# Patient Record
Sex: Male | Born: 1965 | Race: White | Hispanic: No | Marital: Single | Smoking: Current every day smoker
Health system: Southern US, Community
[De-identification: ages and names within clinical notes are randomized; demographics above are authoritative.]

---

## 2008-11-20 ENCOUNTER — Ambulatory Visit: Payer: Self-pay | Admitting: Family Medicine

## 2008-11-20 DIAGNOSIS — R109 Unspecified abdominal pain: Secondary | ICD-10-CM | POA: Insufficient documentation

## 2008-11-21 ENCOUNTER — Encounter: Admission: RE | Admit: 2008-11-21 | Discharge: 2008-11-21 | Payer: Self-pay | Admitting: Family Medicine

## 2008-11-22 ENCOUNTER — Encounter: Payer: Self-pay | Admitting: Family Medicine

## 2008-11-22 ENCOUNTER — Telehealth: Payer: Self-pay | Admitting: Family Medicine

## 2010-11-08 IMAGING — US US ABDOMEN COMPLETE
1 series · 14 of 25 positions shown · non-contrast
Comparison: None

CLINICAL DATA: Nausea, vomiting, abdominal pain

COMPLETE ABDOMINAL ULTRASOUND

[Series 1: us abdomen complete · 0.29mm/px · 14 of 78 slices shown]
[im 1/78]
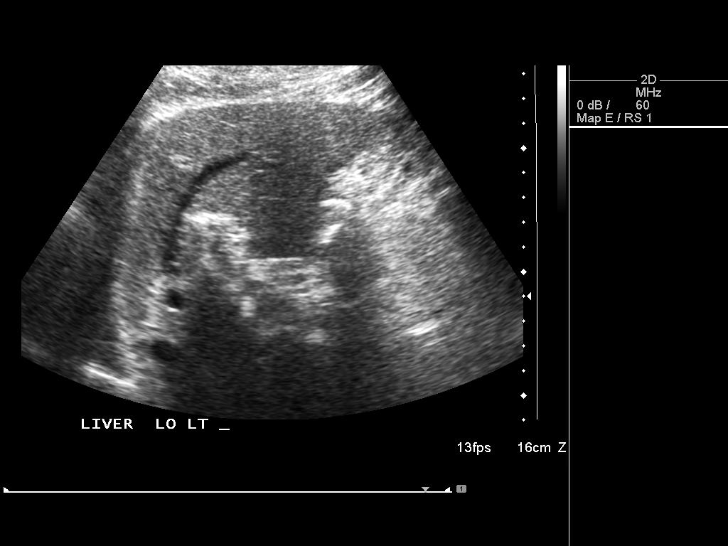
[im 7/78]
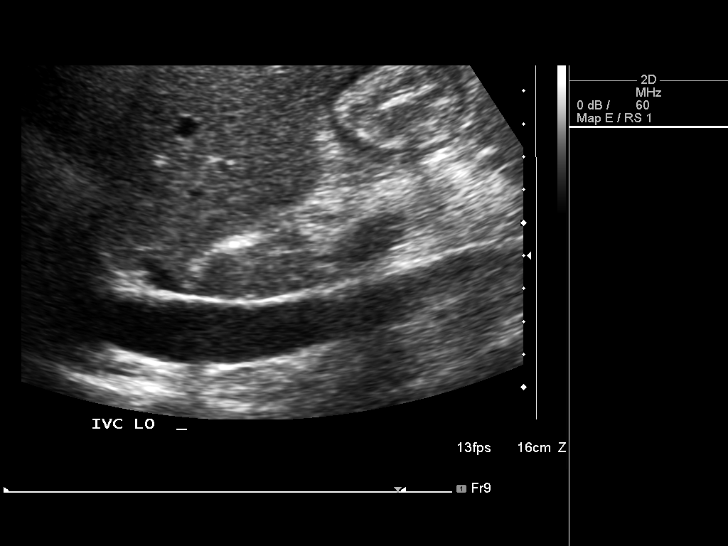
[im 13/78]
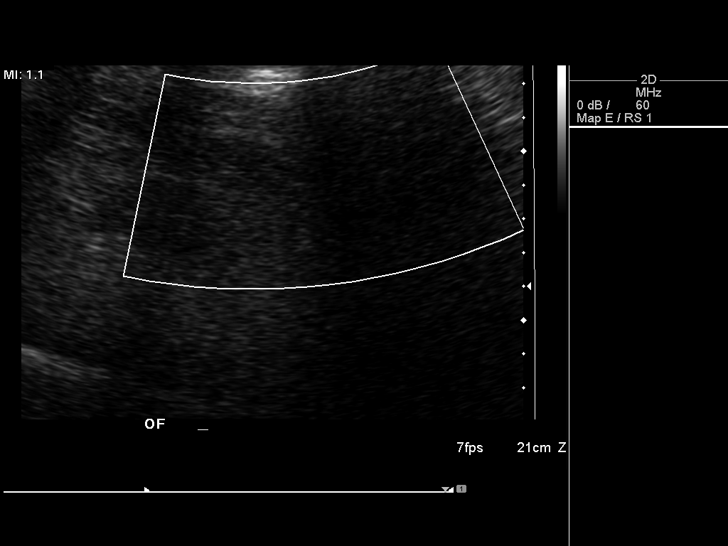
[im 20/78]
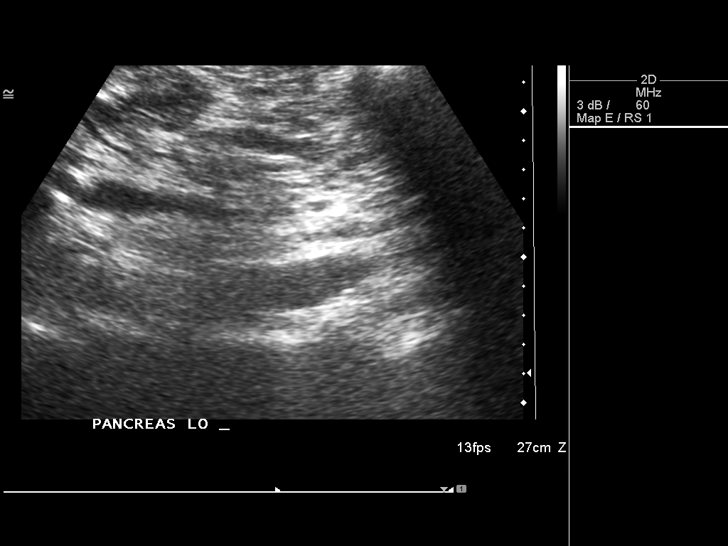
[im 26/78]
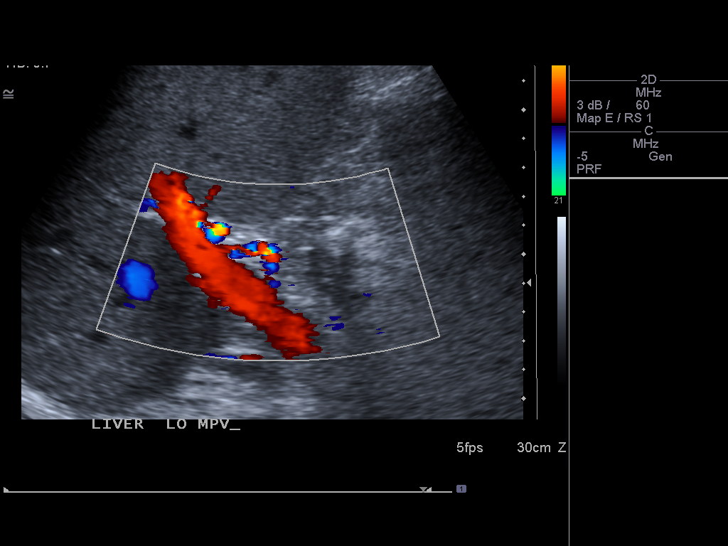
[im 29/78]
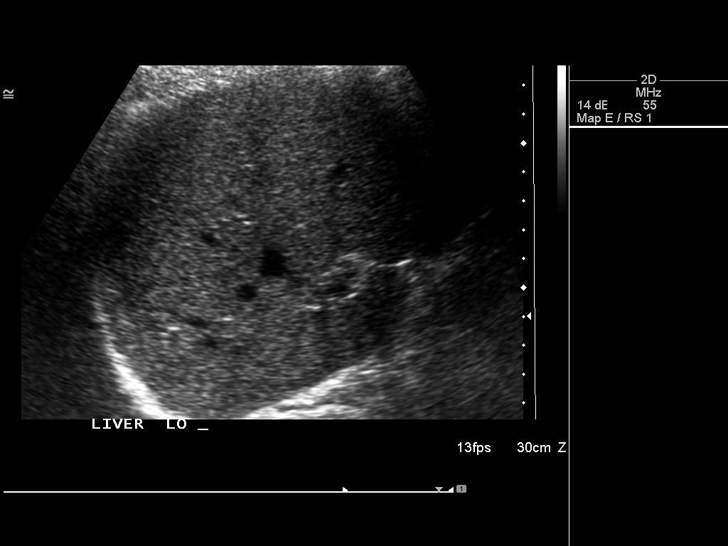
[im 36/78]
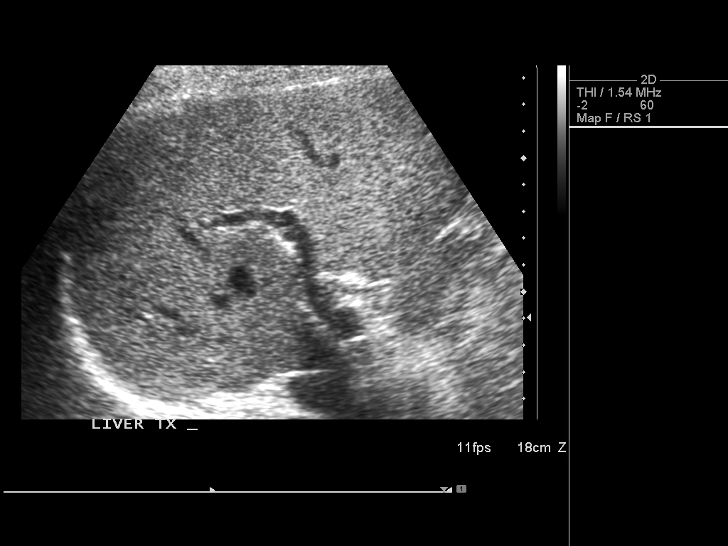
[im 42/78]
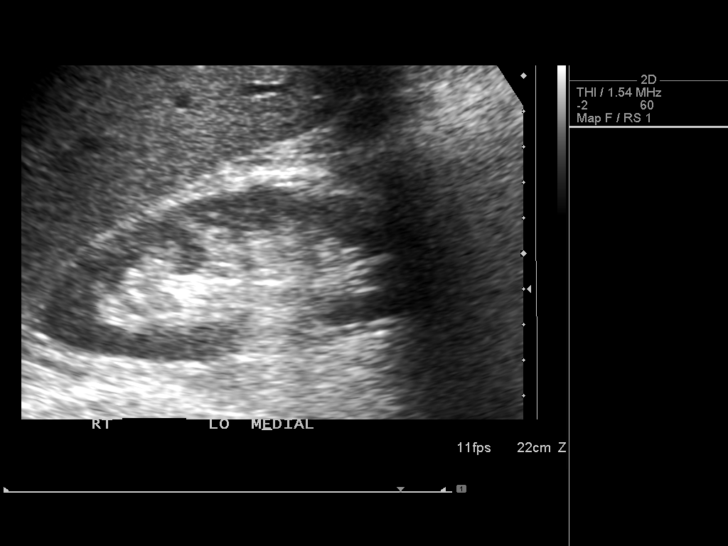
[im 49/78]
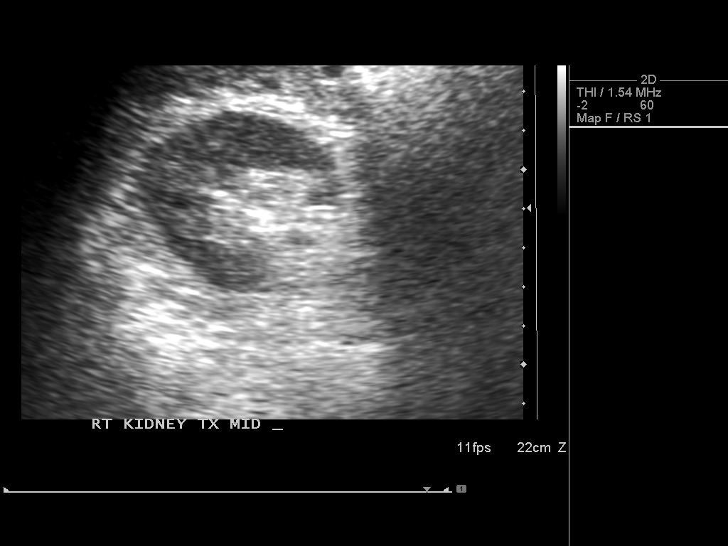
[im 52/78]
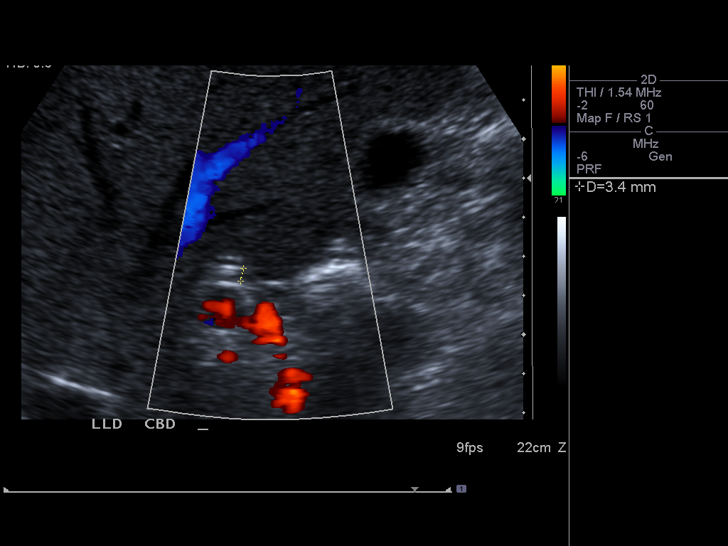
[im 58/78]
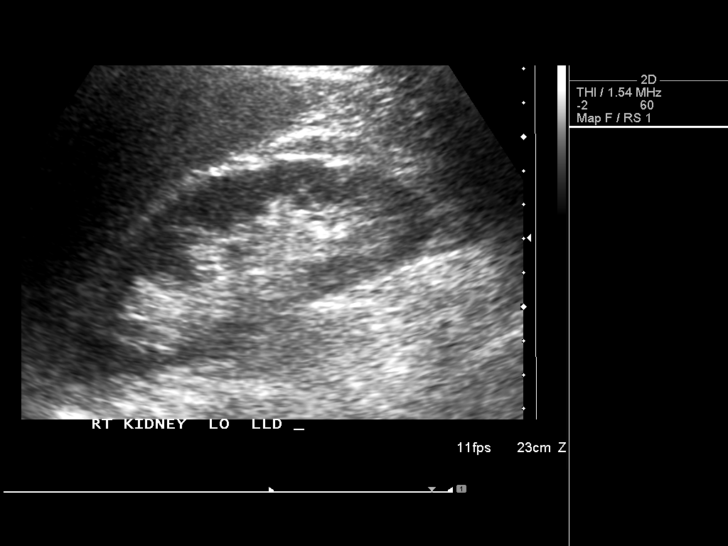
[im 65/78]
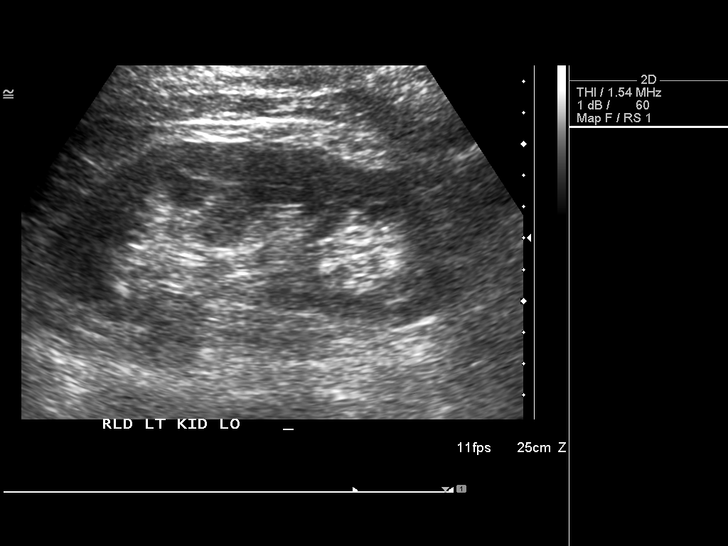
[im 71/78]
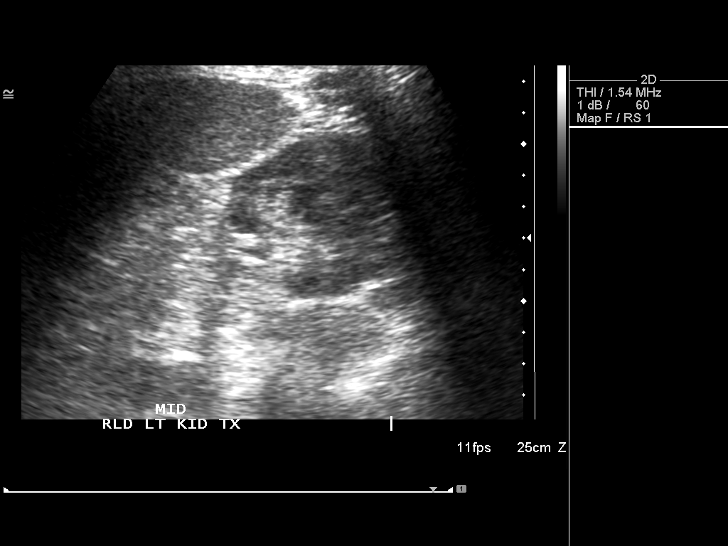
[im 78/78]
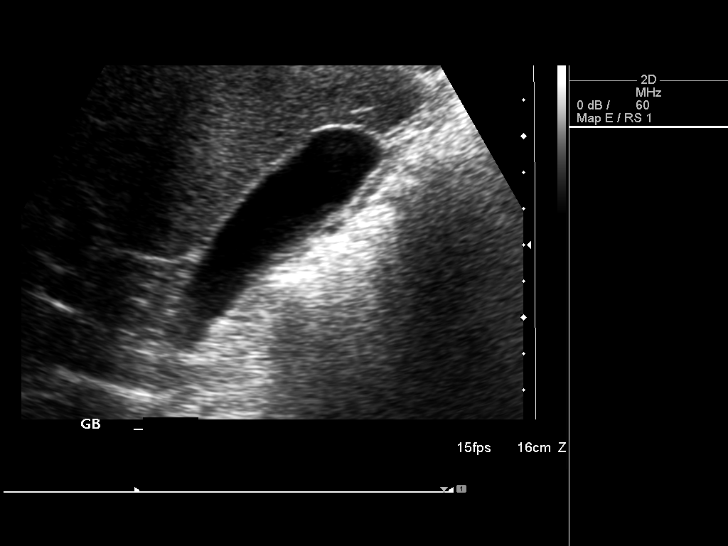

[14 of 25 positions shown; findings below may reference images not displayed]

FINDINGS: Gallbladder:  The gallbladder is visualized and no gallstones are
noted.  No pain is present over the gallbladder upon compression.

Common bile duct:  The common bile duct is normal measuring 3.4 mm,
and is partially obscured by overlying bowel gas.

Liver:  The liver is diffusely echogenic consistent with fatty
infiltration.  No focal abnormality is seen.

IVC:  Appears normal.

Pancreas:  No focal abnormality seen.

Spleen:  The spleen is normal in size measuring 9.8 cm sagittally.

Right Kidney:  No hydronephrosis is seen.  The right kidney
measures 11.9 cm sagittally.

Left Kidney:  No hydronephrosis and the left kidney measures
cm.

Abdominal aorta:  The abdominal aorta is normal in caliber although
partially obscured by bowel gas.
IMPRESSION: 1.  No gallstones.  No ductal dilatation.
2.  Fatty infiltration of the liver.

## 2016-02-04 ENCOUNTER — Emergency Department (HOSPITAL_BASED_OUTPATIENT_CLINIC_OR_DEPARTMENT_OTHER)
Admission: EM | Admit: 2016-02-04 | Discharge: 2016-02-04 | Disposition: A | Discharge status: Home / Self Care | Payer: 59 | Attending: Emergency Medicine | Admitting: Emergency Medicine

## 2016-02-04 ENCOUNTER — Encounter (HOSPITAL_BASED_OUTPATIENT_CLINIC_OR_DEPARTMENT_OTHER): Payer: Self-pay

## 2016-02-04 DIAGNOSIS — R0981 Nasal congestion: Secondary | ICD-10-CM | POA: Diagnosis present

## 2016-02-04 DIAGNOSIS — F17200 Nicotine dependence, unspecified, uncomplicated: Secondary | ICD-10-CM | POA: Diagnosis not present

## 2016-02-04 DIAGNOSIS — B349 Viral infection, unspecified: Secondary | ICD-10-CM

## 2016-02-04 MED ORDER — IBUPROFEN 800 MG PO TABS: 800.0000 mg | ORAL_TABLET | Freq: Three times a day (TID) | ORAL | 0 refills | Status: AC

## 2016-02-04 MED ORDER — ONDANSETRON 4 MG PO TBDP: 4.0000 mg | ORAL_TABLET | Freq: Three times a day (TID) | ORAL | 0 refills | Status: AC | PRN

## 2016-02-04 MED ORDER — BENZONATATE 100 MG PO CAPS: 100.0000 mg | ORAL_CAPSULE | Freq: Three times a day (TID) | ORAL | 0 refills | Status: AC

## 2016-02-04 NOTE — ED Triage Notes (Signed)
C/o sinus congestion, HA x 5 days-NAD-steady gait

## 2016-02-04 NOTE — Discharge Instructions (Signed)
Your symptoms are consistent with a viral illness. Viruses do not require antibiotics. Treatment is symptomatic care and it is important to note that these symptoms may last for 7-10 days. Drink plenty of fluids and get plenty of rest. You should be drinking at least half a liter of water an hour to stay hydrated. Electrolyte drinks are also encouraged. Ibuprofen, Naproxen, or Tylenol for pain or fever. Zofran for nausea. Tessalon for cough. Plain Mucinex may help relieve congestion. Warm liquids or Chloraseptic spray may help soothe a sore throat. Follow up with a primary care provider, as needed, for any future management of this issue. °

## 2016-02-04 NOTE — ED Provider Notes (Signed)
[facility] Note   CSN: [id number] Arrival date & time: 02/04/16  1831  By signing my name below, I, Alan Henderson, attest that this documentation has been prepared under the direction and in the presence of non-physician practitioner, Harolyn RutherfordShawn Keiron Iodice, PA-C. Electronically Signed: Modena JanskyAlbert Henderson, Scribe. 02/04/2016. 8:38 PM.  History   Chief Complaint Chief Complaint  Patient presents with  . Nasal Congestion   The history is provided by the patient. No language interpreter was used.   HPI Comments: Alan Henderson is a 51 y.o. male who presents to the Emergency Department complaining of intermittent moderate cough that started today. He states he had 2 episodes of associated vomiting in addition to associated gradually worsening URI-like symptoms. He has been taking alka-seltzer for symptoms with some relief. He further reports associated symptoms of headache, rhinorrhea, and congestion.  Denies fever/chills, diarrhea, chest pain, shortness breath, or any other complaints.   Patient is a daily smoker.  History reviewed. No pertinent past medical history.  Patient Active Problem List   Diagnosis Date Noted  . ABDOMINAL PAIN, ACUTE 11/20/2008    History reviewed. No pertinent surgical history.     Home Medications    Prior to Admission medications   Medication Sig Start Date End Date Taking? Authorizing Provider  benzonatate (TESSALON) 100 MG capsule Take 1 capsule (100 mg total) by mouth every 8 (eight) hours. 02/04/16   Ethen Bannan C Abrham Maslowski, PA-C  ibuprofen (ADVIL,MOTRIN) 800 MG tablet Take 1 tablet (800 mg total) by mouth 3 (three) times daily. 02/04/16   Nialah Saravia C Beatriz Quintela, PA-C  ondansetron (ZOFRAN ODT) 4 MG disintegrating tablet Take 1 tablet (4 mg total) by mouth every 8 (eight) hours as needed for nausea or vomiting. 02/04/16   Anselm PancoastShawn C Husayn Reim, PA-C    Family History No family history on file.  Social History Social History  Substance Use Topics  . Smoking status:  Current Every Day Smoker  . Smokeless tobacco: Never Used  . Alcohol use Yes     Comment: occ     Allergies   Patient has no known allergies.   Review of Systems Review of Systems  Constitutional: Negative for fever.  HENT: Positive for rhinorrhea.   Respiratory: Positive for cough. Negative for shortness of breath.   Gastrointestinal: Positive for nausea and vomiting. Negative for abdominal pain and diarrhea.  Skin: Negative for rash.  Neurological: Positive for headaches. Negative for weakness and numbness.  All other systems reviewed and are negative.    Physical Exam Updated Vital Signs BP 192/80 (BP Location: Left Arm)   Pulse 82   Temp 97.9 F (36.6 C) (Oral)   Resp 20   Ht 5\' 8"  (1.727 m)   Wt 250 lb (113.4 kg)   SpO2 97%   BMI 38.01 kg/m   Physical Exam  Constitutional: He appears well-developed and well-nourished. No distress.  HENT:  Head: Normocephalic and atraumatic.  Mouth/Throat: Oropharynx is clear and moist.  Eyes: Conjunctivae are normal.  Neck: Normal range of motion. Neck supple.  Cardiovascular: Normal rate, regular rhythm, normal heart sounds and intact distal pulses.   Pulmonary/Chest: Effort normal and breath sounds normal. No respiratory distress.  Abdominal: Soft. There is no tenderness. There is no guarding.  Musculoskeletal: He exhibits no edema.  Lymphadenopathy:    He has no cervical adenopathy.  Neurological: He is alert.  Skin: Skin is warm and dry. He is not diaphoretic.  Psychiatric: He has a normal mood and affect. His behavior  is normal.  Nursing note and vitals reviewed.    ED Treatments / Results  DIAGNOSTIC STUDIES: Oxygen Saturation is 97% on RA, normal by my interpretation.    COORDINATION OF CARE: 8:42 PM- Pt advised of plan for treatment and pt agrees.  Labs (all labs ordered are listed, but only abnormal results are displayed) Labs Reviewed - No data to display  EKG  EKG Interpretation None        Radiology No results found.  Procedures Procedures (including critical care time)  Medications Ordered in ED Medications - No data to display   Initial Impression / Assessment and Plan / ED Course  I have reviewed the triage vital signs and the nursing notes.  Pertinent labs & imaging results that were available during my care of the patient were reviewed by me and considered in my medical decision making (see chart for details).  Clinical Course     Patient presents with URI symptoms. Patient has no red flag symptoms. No signs of sepsis. Patient is nontoxic appearing. His hypertension is noted and was advised to follow-up with a PCP on this matter. No signs of hypertensive emergency.   Final Clinical Impressions(s) / ED Diagnoses   Final diagnoses:  Viral syndrome    New Prescriptions Discharge Medication List as of 02/04/2016  9:00 PM    START taking these medications   Details  benzonatate (TESSALON) 100 MG capsule Take 1 capsule (100 mg total) by mouth every 8 (eight) hours., Starting Wed 02/04/2016, Print    ibuprofen (ADVIL,MOTRIN) 800 MG tablet Take 1 tablet (800 mg total) by mouth 3 (three) times daily., Starting Wed 02/04/2016, Print    ondansetron (ZOFRAN ODT) 4 MG disintegrating tablet Take 1 tablet (4 mg total) by mouth every 8 (eight) hours as needed for nausea or vomiting., Starting Wed 02/04/2016, Print       I personally performed the services described in this documentation, which was scribed in my presence. The recorded information has been reviewed and is accurate.    Anselm Pancoast, PA-C 02/05/16 1507    Marily Memos, MD 02/05/16 1510

## 2023-09-28 ENCOUNTER — Ambulatory Visit
Admission: RE | Admit: 2023-09-28 | Discharge: 2023-09-28 | Disposition: A | Discharge status: Home / Self Care | Source: Ambulatory Visit | Attending: Family Medicine | Admitting: Family Medicine

## 2023-09-28 ENCOUNTER — Other Ambulatory Visit: Payer: Self-pay | Admitting: Family Medicine

## 2023-09-28 DIAGNOSIS — Z021 Encounter for pre-employment examination: Secondary | ICD-10-CM
